# Patient Record
Sex: Female | Born: 1970 | Race: White | Hispanic: Yes | Marital: Married | Smoking: Never smoker
Health system: Southern US, Community
[De-identification: ages and names within clinical notes are randomized; demographics above are authoritative.]

## PROBLEM LIST (undated history)

## (undated) DIAGNOSIS — E119 Type 2 diabetes mellitus without complications: Secondary | ICD-10-CM

---

## 1999-09-22 ENCOUNTER — Encounter: Payer: Self-pay | Admitting: *Deleted

## 1999-09-22 ENCOUNTER — Inpatient Hospital Stay (HOSPITAL_COMMUNITY): Admission: AD | Admit: 1999-09-22 | Discharge: 1999-09-22 | Payer: Self-pay | Admitting: *Deleted

## 1999-10-19 ENCOUNTER — Ambulatory Visit (HOSPITAL_COMMUNITY): Admission: RE | Admit: 1999-10-19 | Discharge: 1999-10-19 | Payer: Self-pay | Admitting: *Deleted

## 1999-12-15 ENCOUNTER — Encounter: Payer: Self-pay | Admitting: Obstetrics

## 1999-12-15 ENCOUNTER — Inpatient Hospital Stay (HOSPITAL_COMMUNITY): Admission: AD | Admit: 1999-12-15 | Discharge: 1999-12-15 | Payer: Self-pay | Admitting: Obstetrics

## 2000-01-05 ENCOUNTER — Inpatient Hospital Stay (HOSPITAL_COMMUNITY): Admission: AD | Admit: 2000-01-05 | Discharge: 2000-01-05 | Payer: Self-pay | Admitting: *Deleted

## 2000-01-13 ENCOUNTER — Inpatient Hospital Stay (HOSPITAL_COMMUNITY): Admission: AD | Admit: 2000-01-13 | Discharge: 2000-01-13 | Payer: Self-pay | Admitting: Obstetrics

## 2000-01-27 ENCOUNTER — Encounter: Admission: RE | Admit: 2000-01-27 | Discharge: 2000-01-27 | Payer: Self-pay | Admitting: Obstetrics & Gynecology

## 2000-02-03 ENCOUNTER — Encounter: Admission: RE | Admit: 2000-02-03 | Discharge: 2000-02-03 | Payer: Self-pay | Admitting: Obstetrics & Gynecology

## 2000-02-17 ENCOUNTER — Inpatient Hospital Stay (HOSPITAL_COMMUNITY): Admission: AD | Admit: 2000-02-17 | Discharge: 2000-02-17 | Payer: Self-pay | Admitting: Obstetrics

## 2000-02-17 ENCOUNTER — Encounter: Admission: RE | Admit: 2000-02-17 | Discharge: 2000-02-17 | Payer: Self-pay | Admitting: Obstetrics & Gynecology

## 2000-02-17 ENCOUNTER — Encounter: Payer: Self-pay | Admitting: Obstetrics

## 2000-02-24 ENCOUNTER — Encounter (HOSPITAL_COMMUNITY): Admission: RE | Admit: 2000-02-24 | Discharge: 2000-02-26 | Payer: Self-pay | Admitting: Obstetrics & Gynecology

## 2000-02-24 ENCOUNTER — Encounter: Admission: RE | Admit: 2000-02-24 | Discharge: 2000-02-24 | Payer: Self-pay | Admitting: Obstetrics & Gynecology

## 2000-02-25 ENCOUNTER — Inpatient Hospital Stay (HOSPITAL_COMMUNITY): Admission: AD | Admit: 2000-02-25 | Discharge: 2000-02-25 | Payer: Self-pay | Admitting: Obstetrics

## 2000-02-27 ENCOUNTER — Inpatient Hospital Stay (HOSPITAL_COMMUNITY): Admission: AD | Admit: 2000-02-27 | Discharge: 2000-02-29 | Payer: Self-pay | Admitting: *Deleted

## 2002-02-12 ENCOUNTER — Emergency Department (HOSPITAL_COMMUNITY): Admission: EM | Admit: 2002-02-12 | Discharge: 2002-02-12 | Payer: Self-pay | Admitting: Emergency Medicine

## 2002-02-19 ENCOUNTER — Emergency Department (HOSPITAL_COMMUNITY): Admission: EM | Admit: 2002-02-19 | Discharge: 2002-02-19 | Payer: Self-pay | Admitting: Emergency Medicine

## 2003-12-03 ENCOUNTER — Ambulatory Visit: Payer: Self-pay | Admitting: Family Medicine

## 2004-03-30 ENCOUNTER — Inpatient Hospital Stay (HOSPITAL_COMMUNITY): Admission: AD | Admit: 2004-03-30 | Discharge: 2004-03-31 | Payer: Self-pay | Admitting: *Deleted

## 2004-04-18 ENCOUNTER — Encounter (INDEPENDENT_AMBULATORY_CARE_PROVIDER_SITE_OTHER): Payer: Self-pay | Admitting: *Deleted

## 2004-04-22 ENCOUNTER — Ambulatory Visit: Payer: Self-pay | Admitting: Family Medicine

## 2004-05-04 ENCOUNTER — Ambulatory Visit: Payer: Self-pay | Admitting: Sports Medicine

## 2004-05-05 ENCOUNTER — Ambulatory Visit (HOSPITAL_COMMUNITY): Admission: RE | Admit: 2004-05-05 | Discharge: 2004-05-05 | Payer: Self-pay | Admitting: *Deleted

## 2004-06-02 ENCOUNTER — Ambulatory Visit: Payer: Self-pay | Admitting: Family Medicine

## 2004-07-06 ENCOUNTER — Ambulatory Visit: Payer: Self-pay | Admitting: Sports Medicine

## 2004-07-06 ENCOUNTER — Ambulatory Visit: Payer: Self-pay | Admitting: Obstetrics and Gynecology

## 2004-07-06 ENCOUNTER — Inpatient Hospital Stay (HOSPITAL_COMMUNITY): Admission: AD | Admit: 2004-07-06 | Discharge: 2004-07-06 | Payer: Self-pay | Admitting: *Deleted

## 2004-07-23 ENCOUNTER — Ambulatory Visit: Payer: Self-pay | Admitting: Family Medicine

## 2004-08-18 ENCOUNTER — Ambulatory Visit: Payer: Self-pay | Admitting: Family Medicine

## 2004-08-28 ENCOUNTER — Ambulatory Visit: Payer: Self-pay | Admitting: Family Medicine

## 2004-09-02 ENCOUNTER — Ambulatory Visit: Payer: Self-pay | Admitting: Family Medicine

## 2004-09-05 ENCOUNTER — Ambulatory Visit: Payer: Self-pay | Admitting: *Deleted

## 2004-09-05 ENCOUNTER — Ambulatory Visit: Payer: Self-pay | Admitting: Obstetrics and Gynecology

## 2004-09-05 ENCOUNTER — Inpatient Hospital Stay (HOSPITAL_COMMUNITY): Admission: AD | Admit: 2004-09-05 | Discharge: 2004-09-07 | Payer: Self-pay | Admitting: Obstetrics and Gynecology

## 2004-09-07 ENCOUNTER — Encounter (INDEPENDENT_AMBULATORY_CARE_PROVIDER_SITE_OTHER): Payer: Self-pay | Admitting: Specialist

## 2004-10-26 ENCOUNTER — Ambulatory Visit: Payer: Self-pay | Admitting: Sports Medicine

## 2006-03-18 ENCOUNTER — Encounter (INDEPENDENT_AMBULATORY_CARE_PROVIDER_SITE_OTHER): Payer: Self-pay | Admitting: *Deleted

## 2006-11-01 ENCOUNTER — Emergency Department (HOSPITAL_COMMUNITY): Admission: EM | Admit: 2006-11-01 | Discharge: 2006-11-01 | Payer: Self-pay | Admitting: Emergency Medicine

## 2006-11-06 ENCOUNTER — Emergency Department (HOSPITAL_COMMUNITY): Admission: EM | Admit: 2006-11-06 | Discharge: 2006-11-06 | Payer: Self-pay | Admitting: Emergency Medicine

## 2006-11-15 ENCOUNTER — Encounter (INDEPENDENT_AMBULATORY_CARE_PROVIDER_SITE_OTHER): Payer: Self-pay | Admitting: General Surgery

## 2006-11-15 ENCOUNTER — Ambulatory Visit (HOSPITAL_COMMUNITY): Admission: RE | Admit: 2006-11-15 | Discharge: 2006-11-15 | Payer: Self-pay | Admitting: General Surgery

## 2010-06-02 NOTE — Op Note (Signed)
NAME:  Samantha Douglas, Samantha Douglas       ACCOUNT NO.:  [id number]   MEDICAL RECORD NO.:  [id number]          PATIENT TYPE:  AMB   LOCATION:  DAY                          FACILITY:  [facility]   PHYSICIAN:  Adolph Pollack, M.D.DATE OF BIRTH:  [birth year]   DATE OF PROCEDURE:  11/15/2006  DATE OF DISCHARGE:                               OPERATIVE REPORT   PREOPERATIVE DIAGNOSIS:  Symptomatic cholelithiasis.   POSTOPERATIVE DIAGNOSIS:  Symptomatic cholelithiasis plus chronic  cholecystitis.   PROCEDURE:  Laparoscopic cholecystectomy with intraoperative  cholangiogram.   SURGEON:  Adolph Pollack, M.D.   ANESTHESIA:  General.   INDICATIONS:  Ms. Samantha Douglas is a 40 year old female who presented to the  emergency department with right upper quadrant epigastric pain.  She was  noted to have gallstones but no liver function elevation.  She was  treated medically and improved some and now presents for elective  cholecystectomy.  This procedure and the risks have been discussed with  her preoperatively.   TECHNIQUE:  She was brought to the operating room, placed supine on the  operating table, and a general anesthetic was administered.  The  abdominal wall was sterilely prepped and draped.   Dilute contrast was injected into the supraumbilical region.  A small  supraumbilical transverse incision was made through the skin and  subcutaneous tissue.  The midline fascia was identified and small  incision made in it, and the peritoneal cavity was entered.  A  pursestring suture of 0 Vicryl was placed around the fascial edges.  A  Hassan trocar was introduced into the peritoneal cavity, and the  pneumoperitoneum was created by insufflation of CO2 gas.   Following this, the laparoscope was introduced, and no underlying  visceral injury was noted.  She was placed in the reverse Trendelenburg  position with the right side tilted up.  Adhesions were noted between  the liver and the anterior abdominal  wall.   An 11-mm trocar was placed in the epigastric incision and two 5-mm  trocars were placed in the right upper quadrant region.  The fundus of  the gallbladder was grasped.  There were adhesions between it and the  omentum and duodenum.  These were taken down sharply.  The fundus was  retracted toward the right shoulder.  The infundibulum was grasped, and  there was a stone in the infundibulum.  I carefully mobilized the  infundibulum with blunt dissection, staying close to it.  I was then  able to identify the cystic duct and create a window around it.  A clip  was placed at the cystic duct/ gallbladder junction, and a small  incision was made in the cystic duct.  A cholangiocatheter was placed  through the anterior abdominal wall, placed into the cystic duct, and a  cholangiogram was performed.   Under real time fluoroscopy, dilute contrast was injected into the  cystic duct which was of short length.  The common hepatic, right and  left hepatic, and common bile ducts all filled promptly and contrast  drained into the duodenum.  There was no obvious evidence of  obstruction.  Final report is pending the radiologist's interpretation.  The cholangiocatheter was removed, and the cystic duct was clipped 3  times proximally and divided.  Using blunt dissection close to the  gallbladder, I identified a posterior and an anterior branch of the  cystic artery.  Windows were created around these, and they were clipped  and divided.  I then dissected the gallbladder free from the liver using  the electrocautery and placed it in an Endopouch bag.   Of note was that some of the adhesions between the liver and the  anterior abdominal wall caused a little bit of disruption of the liver  capsule with minimal bleeding.  This was controlled just by placing a  piece of Surgicel in that area.   The gallbladder fossa was copiously irrigated.  Bleeding points were  controlled with electrocautery.   It was inspected again, and no bleeding  or bile leak was noted.   Irrigation fluid was evacuated.  I then removed the gallbladder through  the supraumbilical incision in the Endopouch bag.  The supraumbilical  fascial defect was closed under laparoscopic vision by tightening up and  tying of the pursestring suture.  The trocars were removed and  pneumoperitoneum was released.  The skin incisions were closed with 4-0  Monocryl subcuticular sutures followed by Steri-Strips and sterile  dressings.   She tolerated procedure without apparent complications was taken to the  recovery room in satisfactory condition.      Adolph Pollack, M.D.  Electronically Signed     TJR/MEDQ  D:  11/15/2006  T:  11/15/2006  Job:  [id number]

## 2010-06-05 NOTE — Op Note (Signed)
NAME:  Samantha Douglas, Samantha Douglas       ACCOUNT NO.:  [id number]   MEDICAL RECORD NO.:  [id number]          PATIENT TYPE:  INP   LOCATION:  9140                          FACILITY:  [facility]   PHYSICIAN:  Phil D. Okey Dupre, M.D.     DATE OF BIRTH:  13-Nov-1970   DATE OF PROCEDURE:  09/07/2004  DATE OF DISCHARGE:                                 OPERATIVE REPORT   PROCEDURE:  Bilateral tubal ligation with partial bilateral salpingectomy.   PREOPERATIVE DIAGNOSIS:  Voluntary sterilization.   POSTOPERATIVE DIAGNOSIS:  Voluntary sterilization.   SURGEON:  Javier Glazier. Okey Dupre, M.D.   FIRST ASSISTANT:  Tracy L. Mayford Knife, M.D.   ANESTHESIA:  General.   SPECIMENS TO PATHOLOGY:  Portions of each fallopian tube.   TOTAL BLOOD LOSS:  Less than 5 mL.   PROCEDURE:  Under satisfactory general anesthesia, the patient in a dorsal  supine position.  The abdomen was prepped and draped in the usual sterile  manner.  A 3 cm transverse incision was made just below the umbilicus  approximately 1 cm below.  The abdomen was entered by layers.  On entering  the peritoneal cavity, the right fallopian tube was identified, grasped with  a Babcock clamp and followed down to confirm that it was tube by visualizing  the fimbriated end and opening made in the avascular portion of mesosalpinx  with a hemostat, through which was drawn a #1 plain catgut suture and tied  around the distal and proximal ends of the tube to form a loop above the tie  approximately 2 cm in length.  The second tie placed below the  aforementioned tie, and the section of tube above the ties excised.  The  free ends of the tube thus exposed were coagulated with hot cautery and the  section of tube removed sent for pathological diagnosis.  The same procedure  was followed with regard to the left fallopian tube.  No bleeding was noted,  and the fascia was closed with continuous running 0 Vicryl on an atraumatic  needle.  The skin edges were approximated with  3-0 Vicryl suture, a dry  sterile dressing was applied, the patient transferred to the recovery range  of motion in satisfactory condition, having tolerated the procedure well.  Tape, instrument, sponge and needle count were reported correct at the end  of the procedure.           ______________________________  Javier Glazier. Okey Dupre, M.D.     PDR/MEDQ  D:  09/07/2004  T:  09/07/2004  Job:  [id number]

## 2010-10-28 LAB — POCT PREGNANCY, URINE
Operator id: 294511
Preg Test, Ur: NEGATIVE

## 2010-10-28 LAB — COMPREHENSIVE METABOLIC PANEL
ALT: 27
BUN: 15
Calcium: 8.7
Chloride: 103
Potassium: 3.2 — ABNORMAL LOW

## 2010-10-28 LAB — URINALYSIS, ROUTINE W REFLEX MICROSCOPIC
Nitrite: NEGATIVE
Protein, ur: NEGATIVE
Specific Gravity, Urine: 1.018
Urobilinogen, UA: 1

## 2010-10-28 LAB — DIFFERENTIAL
Basophils Absolute: 0
Basophils Relative: 0
Eosinophils Absolute: 0.1
Eosinophils Relative: 1
Lymphocytes Relative: 19
Neutro Abs: 5.2
Neutrophils Relative %: 74

## 2010-10-28 LAB — LIPASE, BLOOD: Lipase: 24

## 2010-10-28 LAB — CBC
HCT: 39.2
MCHC: 34.3
MCV: 89.9
WBC: 7

## 2010-10-29 LAB — I-STAT 8, (EC8 V) (CONVERTED LAB)
Acid-base deficit: 2
Bicarbonate: 23.2
HCT: 41
Hemoglobin: 13.9
Potassium: 3.3 — ABNORMAL LOW
Sodium: 137
pCO2, Ven: 41.2 — ABNORMAL LOW

## 2010-10-29 LAB — CBC
HCT: 39.2
Hemoglobin: 13.6
RDW: 12.7

## 2010-10-29 LAB — POCT I-STAT CREATININE
Creatinine, Ser: 0.8
Operator id: 279831

## 2010-10-29 LAB — HEPATIC FUNCTION PANEL
Alkaline Phosphatase: 67
Bilirubin, Direct: <0.1
Total Bilirubin: 0.5

## 2010-10-29 LAB — POCT PREGNANCY, URINE
Operator id: 279831
Preg Test, Ur: NEGATIVE

## 2010-10-29 LAB — DIFFERENTIAL
Eosinophils Absolute: 0
Eosinophils Relative: 1
Lymphocytes Relative: 11 — ABNORMAL LOW
Neutro Abs: 7.7

## 2010-10-29 LAB — URINALYSIS, ROUTINE W REFLEX MICROSCOPIC
Bilirubin Urine: NEGATIVE
Ketones, ur: NEGATIVE
Nitrite: NEGATIVE
Protein, ur: NEGATIVE
Specific Gravity, Urine: 1.03

## 2021-06-16 ENCOUNTER — Emergency Department (HOSPITAL_COMMUNITY): Payer: Self-pay

## 2021-06-16 ENCOUNTER — Other Ambulatory Visit: Payer: Self-pay

## 2021-06-16 ENCOUNTER — Encounter (HOSPITAL_COMMUNITY): Payer: Self-pay

## 2021-06-16 ENCOUNTER — Emergency Department (HOSPITAL_COMMUNITY)
Admission: EM | Admit: 2021-06-16 | Discharge: 2021-06-16 | Disposition: A | Discharge status: Home / Self Care | Payer: Self-pay | Attending: Emergency Medicine | Admitting: Emergency Medicine

## 2021-06-16 DIAGNOSIS — Q758 Other specified congenital malformations of skull and face bones: Secondary | ICD-10-CM

## 2021-06-16 DIAGNOSIS — M542 Cervicalgia: Secondary | ICD-10-CM | POA: Insufficient documentation

## 2021-06-16 DIAGNOSIS — Q759 Congenital malformation of skull and face bones, unspecified: Secondary | ICD-10-CM | POA: Insufficient documentation

## 2021-06-16 DIAGNOSIS — R42 Dizziness and giddiness: Secondary | ICD-10-CM | POA: Insufficient documentation

## 2021-06-16 HISTORY — DX: Type 2 diabetes mellitus without complications: E11.9

## 2021-06-16 LAB — BASIC METABOLIC PANEL
Anion gap: 12 (ref 5–15)
BUN: 8 mg/dL (ref 6–20)
CO2: 22 mmol/L (ref 22–32)
Calcium: 9 mg/dL (ref 8.9–10.3)
Chloride: 98 mmol/L (ref 98–111)
Creatinine, Ser: 0.7 mg/dL (ref 0.44–1.00)
GFR, Estimated: >60 mL/min (ref >60)
Glucose, Bld: 373 mg/dL — ABNORMAL HIGH (ref 70–99)
Potassium: 3.4 mmol/L — ABNORMAL LOW (ref 3.5–5.1)
Sodium: 132 mmol/L — ABNORMAL LOW (ref 135–145)

## 2021-06-16 LAB — CBC
HCT: 42.7 % (ref 36.0–46.0)
Hemoglobin: 15.3 g/dL — ABNORMAL HIGH (ref 12.0–15.0)
MCH: 31.7 pg (ref 26.0–34.0)
MCHC: 35.8 g/dL (ref 30.0–36.0)
MCV: 88.6 fL (ref 80.0–100.0)
Platelets: 189 10*3/uL (ref 150–400)
RBC: 4.82 MIL/uL (ref 3.87–5.11)
RDW: 12.5 % (ref 11.5–15.5)
WBC: 8.2 10*3/uL (ref 4.0–10.5)
nRBC: 0 % (ref 0.0–0.2)

## 2021-06-16 LAB — URINALYSIS, ROUTINE W REFLEX MICROSCOPIC
Bilirubin Urine: NEGATIVE
Glucose, UA: >=500 mg/dL — AB
Hgb urine dipstick: NEGATIVE
Ketones, ur: 20 mg/dL — AB
Leukocytes,Ua: NEGATIVE
Nitrite: POSITIVE — AB
Protein, ur: NEGATIVE mg/dL
Specific Gravity, Urine: 1.033 — ABNORMAL HIGH (ref 1.005–1.030)
pH: 5 (ref 5.0–8.0)

## 2021-06-16 LAB — TROPONIN I (HIGH SENSITIVITY)
Troponin I (High Sensitivity): 3 ng/L (ref <18)
Troponin I (High Sensitivity): 3 ng/L (ref <18)

## 2021-06-16 MED ORDER — IOHEXOL 350 MG/ML SOLN
80.0000 mL | Freq: Once | INTRAVENOUS | Status: AC | PRN
  Administered 2021-06-16: 80 mL via INTRAVENOUS

## 2021-06-16 MED ORDER — KETOROLAC TROMETHAMINE 15 MG/ML IJ SOLN
15.0000 mg | Freq: Once | INTRAMUSCULAR | Status: AC
  Administered 2021-06-16: 15 mg via INTRAVENOUS
  Filled 2021-06-16: qty 1

## 2021-06-16 MED ORDER — SODIUM CHLORIDE 0.9 % IV BOLUS
1000.0000 mL | Freq: Once | INTRAVENOUS | Status: AC
  Administered 2021-06-16: 1000 mL via INTRAVENOUS

## 2021-06-16 MED ORDER — LIDOCAINE 5 % EX PTCH
1.0000 | MEDICATED_PATCH | CUTANEOUS | Status: DC
  Administered 2021-06-16: 1 via TRANSDERMAL
  Filled 2021-06-16: qty 1

## 2021-06-16 MED ORDER — PREDNISONE 20 MG PO TABS
60.0000 mg | ORAL_TABLET | Freq: Once | ORAL | Status: AC
  Administered 2021-06-16: 60 mg via ORAL
  Filled 2021-06-16: qty 3

## 2021-06-16 NOTE — ED Provider Notes (Signed)
[facility] EMERGENCY DEPARTMENT Provider Note   CSN: [id number] Arrival date & time: 06/16/21  1135     History  Chief Complaint  Patient presents with   Dizziness   Nausea    Samantha Douglas is a 51 y.o. female with PMHx Diabetes who presents to the ED today from UC with complaint of dizziness/pre syncope that began this morning. LKN 11:30 PM last night.  Patient reports that she woke up this morning feeling like she could pass out.  She was also experiencing blurred vision in her left eye, neck pain, left arm pain, difficulty breathing, nausea, and diaphoresis. She went to work and had her blood pressure checked at that time and it was in the 123456 systolic.  She does not take anything for blood pressure.  She went to urgent care who reports noticed a mild facial droop on the right side and advised she come to the ED for further evaluation with concern for possibility of stroke.  Patient states that she continues to have pain in her left arm and neck however otherwise feels improved.  She denies any history of stroke in the past.  Denies specific chest pain.  Spouse is at bedside and states that she is acting at baseline and has not noticed any speech changes.  She denies any unilateral weakness or numbness.  She has no other complaints at this time.  The history is provided by the patient, the spouse and medical records.      Home Medications Prior to Admission medications   Not on File      Allergies    Patient has no known allergies.    Review of Systems   Review of Systems  Constitutional:  Positive for diaphoresis. Negative for chills and fever.  Eyes:  Positive for visual disturbance (left eye blurry vision).  Respiratory:  Positive for shortness of breath.   Cardiovascular:  Negative for chest pain.  Gastrointestinal:  Positive for nausea. Negative for abdominal pain, diarrhea and vomiting.  Musculoskeletal:  Positive for arthralgias and neck  pain.  Neurological:  Positive for dizziness, facial asymmetry and light-headedness. Negative for syncope, speech difficulty, weakness and numbness.  All other systems reviewed and are negative.  Physical Exam Updated Vital Signs BP 129/79   Pulse (!) 102   Temp 97.6 F (36.4 C) (Oral)   Resp 16   Ht 5\' 1"  (1.549 m)   Wt 79.4 kg   SpO2 96%   BMI 33.07 kg/m  Physical Exam Vitals and nursing note reviewed.  Constitutional:      Appearance: She is not ill-appearing or diaphoretic.  HENT:     Head: Normocephalic and atraumatic.  Eyes:     Extraocular Movements: Extraocular movements intact.     Conjunctiva/sclera: Conjunctivae normal.     Pupils: Pupils are equal, round, and reactive to light.  Neck:     Comments: No C midline spinal TTP. Mild bilateral paracervical/trapezius musculature TTP.  Cardiovascular:     Rate and Rhythm: Normal rate and regular rhythm.     Pulses: Normal pulses.  Pulmonary:     Effort: Pulmonary effort is normal.     Breath sounds: Normal breath sounds. No wheezing, rhonchi or rales.  Abdominal:     Palpations: Abdomen is soft.     Tenderness: There is no abdominal tenderness.  Musculoskeletal:     Cervical back: Neck supple.     Comments: No swelling appreciated to LUE. No obvious TTP however patient endorses  pain throughout. ROM intact to LUE. Strength 5/5. Sensation intact throughout. 2+ radial pulse.   Skin:    General: Skin is warm and dry.  Neurological:     Mental Status: She is alert.     Comments: Alert and oriented to self, place, time and event.   Speech is fluent, clear without dysarthria or dysphasia.   Strength 5/5 in upper/lower extremities   Sensation intact in upper/lower extremities   Normal gait.  Negative Romberg. No pronator drift.  Normal finger-to-nose and feet tapping.  CN I not tested  CN II grossly intact visual fields bilaterally. Did not visualize posterior eye.  CN III, IV, VI PERRLA and EOMs intact bilaterally   CN V Intact sensation to sharp and light touch to the face  Questionable slight right sided facial droop CN VIII not tested  CN IX, X no uvula deviation, symmetric rise of soft palate  CN XI 5/5 SCM and trapezius strength bilaterally  CN XII Midline tongue protrusion, symmetric L/R movements     ED Results / Procedures / Treatments   Labs (all labs ordered are listed, but only abnormal results are displayed) Labs Reviewed  BASIC METABOLIC PANEL - Abnormal; Notable for the following components:      Result Value   Sodium 132 (*)    Potassium 3.4 (*)    Glucose, Bld 373 (*)    All other components within normal limits  CBC - Abnormal; Notable for the following components:   Hemoglobin 15.3 (*)    All other components within normal limits  URINALYSIS, ROUTINE W REFLEX MICROSCOPIC - Abnormal; Notable for the following components:   APPearance HAZY (*)    Specific Gravity, Urine 1.033 (*)    Glucose, UA >=500 (*)    Ketones, ur 20 (*)    Nitrite POSITIVE (*)    Bacteria, UA FEW (*)    All other components within normal limits  CBG MONITORING, ED  TROPONIN I (HIGH SENSITIVITY)  TROPONIN I (HIGH SENSITIVITY)    EKG None  Radiology CT ANGIO HEAD NECK W WO CM  Result Date: 06/16/2021 CLINICAL DATA:  Stroke follow-up. Dizziness and nausea. Left arm and neck pain. EXAM: CT ANGIOGRAPHY HEAD AND NECK TECHNIQUE: Multidetector CT imaging of the head and neck was performed using the standard protocol during bolus administration of intravenous contrast. Multiplanar CT image reconstructions and MIPs were obtained to evaluate the vascular anatomy. Carotid stenosis measurements (when applicable) are obtained utilizing NASCET criteria, using the distal internal carotid diameter as the denominator. RADIATION DOSE REDUCTION: This exam was performed according to the departmental dose-optimization program which includes automated exposure control, adjustment of the mA and/or kV according to patient  size and/or use of iterative reconstruction technique. CONTRAST:  78mL OMNIPAQUE IOHEXOL 350 MG/ML SOLN COMPARISON:  None Available. FINDINGS: CT HEAD FINDINGS Brain: No evidence of acute infarction, hemorrhage, hydrocephalus, extra-axial collection or mass lesion/mass effect. 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 C1 is fused with the occipital bone. The tip of the dens is 4 mm above McRae line consistent with basilar invagination. 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 Cerebellar tonsils above the foramen magnum. Vascular: Negative for hyperdense vessel Skull: No focal lesion identified. Sinuses: Mild mucosal edema maxillary sinus bilaterally. Remaining sinuses clear. Orbits: Negative Review of the MIP images confirms the above findings CTA NECK FINDINGS Aortic arch: Standard branching. Imaged portion shows no evidence of aneurysm or dissection. No significant stenosis  of the major arch vessel origins. Right carotid system: Normal right carotid. Negative for atherosclerotic disease or dissection Left carotid system: Normal left carotid. Negative for atherosclerotic disease or dissection. Vertebral arteries: Normal vertebral artery bilaterally. 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 Other neck: Negative for mass or adenopathy Upper chest: Lung apices clear bilaterally Review of the MIP images confirms the above findings CTA HEAD FINDINGS Anterior circulation: Internal carotid artery widely patent through the skull base and cavernous segment. No stenosis or aneurysm. Hypoplastic right A1 segment. Anterior and middle cerebral arteries patent bilaterally without stenosis. Posterior circulation: Both vertebral arteries patent to the basilar without stenosis. PICA patent bilaterally. Basilar patent. AICA, superior cerebellar and posterior cerebral arteries patent  bilaterally. Fetal origin right posterior cerebral artery. Negative for aneurysm. Venous sinuses: Normal venous enhancement Anatomic variants: None Review of the MIP images confirms the above findings IMPRESSION: 1. CT head negative for acute abnormality. 2. 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 3. Normal CT angio head and neck. No intracranial large vessel occlusion or stenosis. Negative for dissection. Electronically Signed   By: Franchot Gallo M.D.   On: 06/16/2021 17:30   DG Chest 2 View  Result Date: 06/16/2021 CLINICAL DATA:  Pt arrived POV from urgent care c/o left arm and neck pain along with dizziness and nausea. Pt states she was at work and got dizzy, felt like she was going to throw up and got extremely hot. EXAM: CHEST - 2 VIEW COMPARISON:  None Available. FINDINGS: Lungs are clear. Heart size and mediastinal contours are within normal limits. No effusion.  No pneumothorax. Spurring in the lumbar spine.  Cholecystectomy clips. IMPRESSION: No acute cardiopulmonary disease. Electronically Signed   By: Lucrezia Europe M.D.   On: 06/16/2021 16:43   MR BRAIN WO CONTRAST  Result Date: 06/16/2021 CLINICAL DATA:  Follow-up examination for acute dizziness. EXAM: MRI HEAD WITHOUT CONTRAST TECHNIQUE: Multiplanar, multiecho pulse sequences of the brain and surrounding structures were obtained without intravenous contrast. COMPARISON:  Prior CT from earlier the same day. FINDINGS: Brain: Cerebral volume within normal limits. Scattered patchy T2/FLAIR hyperintensity involving the periventricular, deep, and subcortical white matter both cerebral hemispheres, nonspecific, but most likely related chronic microvascular ischemic disease. No evidence for acute or subacute ischemia. Gray-white matter differentiation maintained. No areas of chronic cortical infarction. No acute intracranial hemorrhage. Single punctate chronic micro hemorrhage noted involving the  right periatrial white matter, of doubtful significance in isolation. No mass lesion, midline shift or mass effect. No hydrocephalus or extra-axial fluid collection. Pituitary gland suprasellar region within normal limits. Midline structures intact. Vascular: Major intracranial vascular flow voids are maintained. 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 Bone marrow signal intensity within normal limits. No scalp soft tissue abnormality. Sinuses/Orbits: Globes and orbital soft tissues within normal limits. Mild scattered mucosal thickening noted throughout the paranasal sinuses. Trace right mastoid effusion, of doubtful significance. Inner ear structures grossly normal. Other: None. IMPRESSION: 1. No acute intracranial abnormality. 2. Mild cerebral white matter disease, nonspecific, but most likely related chronic microvascular ischemic disease. 3. 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 Electronically Signed   By: Jeannine Boga M.D.   On: 06/16/2021 21:01    Procedures Procedures    Medications Ordered in ED Medications  lidocaine (LIDODERM)  5 % 1 patch (1 patch Transdermal Patch Applied 06/16/21 1841)  sodium chloride 0.9 % bolus 1,000 mL (0 mLs Intravenous Stopped 06/16/21 2204)  iohexol (OMNIPAQUE) 350 MG/ML injection 80 mL (80 mLs Intravenous Contrast Given 06/16/21 1708)  predniSONE (DELTASONE) tablet 60 mg (60 mg Oral Given 06/16/21 1841)  ketorolac (TORADOL) 15 MG/ML injection 15 mg (15 mg Intravenous Given 06/16/21 1841)    ED Course/ Medical Decision Making/ A&P                           Medical Decision Making 51 year old female who presents to the ED today from urgent care with concern for stroke.  Last known normal 11:30 PM last night.  Woke up this morning with associated dizziness/lightheadedness, nausea, diaphoresis, shortness of  breath, neck pain, left arm pain.  Noted to have facial droop at urgent care and sent here for further eval.  On arrival to the ED today blood pressure 143/96.  Remainder vitals are unremarkable.  Patient appears to be in no acute distress at this time.  She had screening labs done while in the waiting room including CBC, BMP, urinalysis, EKG.  CBC without leukocytosis.  Hemoglobin stable. BMP with a sodium of 132 with a potassium of 3.4.  Glucose is elevated at 373, history of diabetes.  Bicarb 22 and no gap to suggest DKA. Urinalysis with increased specific gravity of 1.033 and ketones of 20.  She is nitrite positive.  She denies any urinary symptoms at this time.  We will plan to work-up for stroke at this time.  We will add on troponin given complaint of nausea and diaphoresis with left arm pain and add on chest x-ray.  Given complaint of dizziness, blurry vision we will plan for CTAs to assess for any concern for dissection at this time.  If negative patient may require MRI.  CTAs: IMPRESSION:  1. CT head negative for acute abnormality.  2. 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  3. Normal CT angio head and neck. No intracranial large vessel  occlusion or stenosis. Negative for dissection.   Troponin of 4. Will repeat.   On reeval pt resting comfortably. She reports blurry vision in both eyes compared to just left earlier. Visual Acuity Bilateral Near: 20/100 R Near: 10/32 L Near: 20/100. She mentions feeling like her equilibrium is off with ambulation - given same will proceed with MRI for further eval. She denies any trauma to her neck or arm. She is noted to have bilateral paracervical musculature TTP. She mentions similar pain in the past a couple of months ago however resolved on its own after 3 days. She states pain is about 2/10 however what mostly brought her in was the dizziness today. Will plan for pain medication and  reevaluation. Ultimately if MRI negative pt to be discharged with PCP follow up. Question near syncope today vs acute stroke?  MRI: IMPRESSION:  1. No acute intracranial abnormality.  2. Mild cerebral white matter disease, nonspecific, but most likely  related chronic microvascular ischemic disease.  3. Atlantooccipital assimilation with congenital fusion of C2 and  C3. Associated basilar invagination.   On reeval pt resting comfortably. Reports she is ready to go home. At baseline currently. Will discharge home with soft collar. Pt instructed she can take collar off to shower however otherwise should keep it on. She is in agreement with plan and  stable for discharge home.    Problems Addressed: Basilar invagination: acute illness or injury Dizziness: acute illness or injury Neck pain: acute illness or injury  Amount and/or Complexity of Data Reviewed Labs: ordered. Radiology: ordered. Discussion of management or test interpretation with external provider(s): Discussed case with Dr. Curly Shores Neurology given MRI findings of basilar invagination; who does not recommend anything from her end.   Discussed case with Jinny Blossom with Neurosurgery who discussed case with Dr. Annette Stable - recommends soft collar and outpatient follow up.   Risk Prescription drug management.          Final Clinical Impression(s) / ED Diagnoses Final diagnoses:  Dizziness  Neck pain  Basilar invagination    Rx / DC Orders ED Discharge Orders     None        Discharge Instructions      Please follow up with Dr. Annette Stable Neurosurgery for further evaluation of your findings on MRI today.   It is recommended that you wear the soft collar provided to you at all times. It can be taken off to shower however otherwise should be kept on.   Please also follow up with your PCP for further eval.   Return to the ED for any new/worsening symptoms       Eustaquio Maize, PA-C 06/16/21 2251    Regan Lemming, MD 06/17/21 0104

## 2021-06-16 NOTE — Plan of Care (Signed)
Asked to comment on congenital fusion of the C-spine with basilar invagination in this patient, who per ED provider presented with dizziness while ambulating which has now resolved.  The description of the event sounds more peripheral in nature given that she did not have any symptoms while at rest.  Suggest neurosurgical follow-up, could curbside neurosurgery to confirm patient is safe for discharge as well as this is not my area of expertise

## 2021-06-16 NOTE — Discharge Instructions (Signed)
Please follow up with Dr. Annette Stable Neurosurgery for further evaluation of your findings on MRI today.   It is recommended that you wear the soft collar provided to you at all times. It can be taken off to shower however otherwise should be kept on.   Please also follow up with your PCP for further eval.   Return to the ED for any new/worsening symptoms

## 2021-06-16 NOTE — ED Triage Notes (Signed)
Pt arrived POV from urgent care c/o left arm and neck pain along with dizziness and nausea. Pt states she was at work and got dizzy, felt like she was going to throw up and got extremely hot. Pt states her arm and neck are still bothering her.

## 2022-08-20 IMAGING — CT CT ANGIO HEAD-NECK (W OR W/O PERF)
2 of 11 series · 8 of 33 positions shown · IV contrast (OMNI 350)
Comparison: None Available.

CLINICAL DATA: Stroke follow-up. Dizziness and nausea. Left arm and
neck pain.

EXAM:
CT ANGIOGRAPHY HEAD AND NECK
TECHNIQUE: Multidetector CT imaging of the head and neck was performed using
the standard protocol during bolus administration of intravenous
contrast. Multiplanar CT image reconstructions and MIPs were
obtained to evaluate the vascular anatomy. Carotid stenosis
measurements (when applicable) are obtained utilizing NASCET
criteria, using the distal internal carotid diameter as the
denominator.

[Series 5: cta neck · axial · 0.40mm/px · z∈[-250,+50]mm · 3 of 151 slices shown]
[im 1/151  soft-tissue]
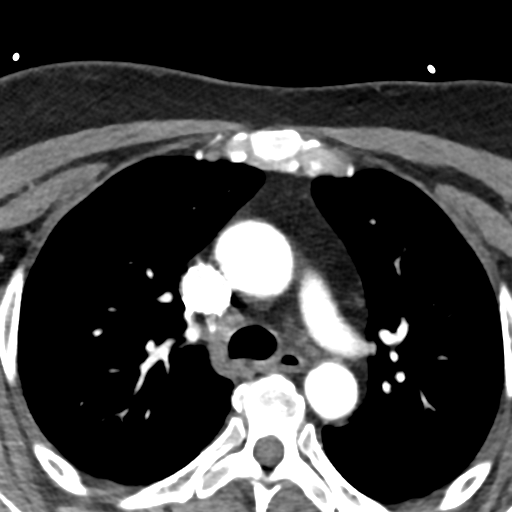
[im 76/151  bone]
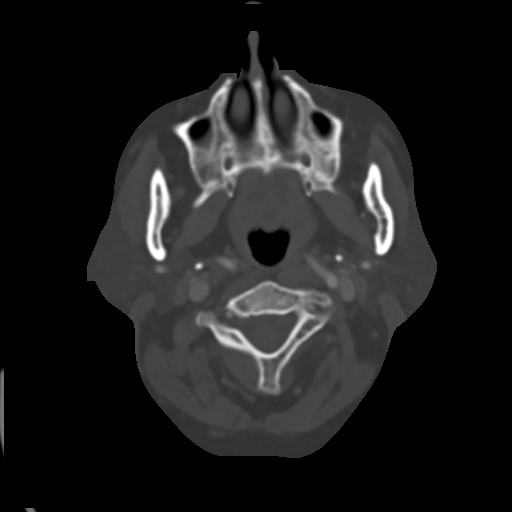
[im 151/151  soft-tissue]
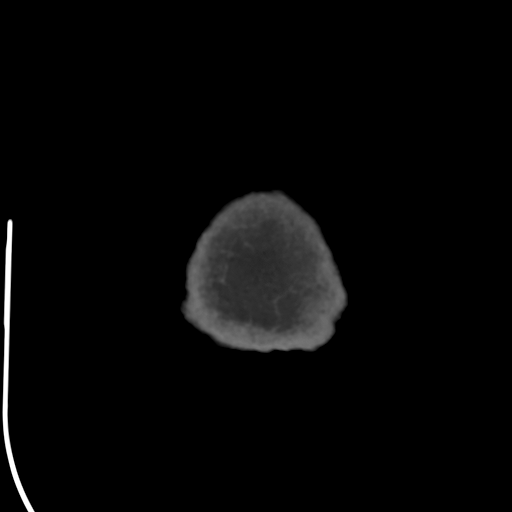

[Series 11: cta neck axial · axial · 0.39mm/px · z∈[-198,+2]mm · 5 of 301 slices shown]
[im 51/301  soft-tissue]
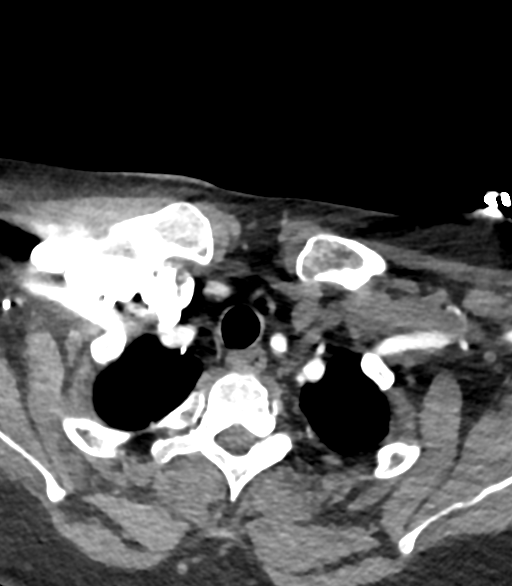
[im 101/301  soft-tissue]
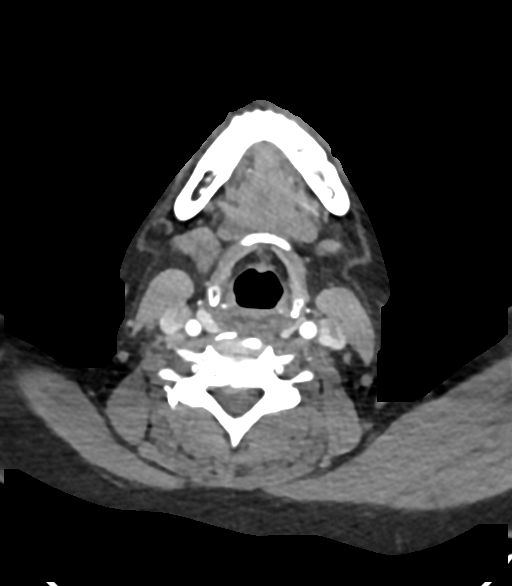
[im 151/301  soft-tissue]
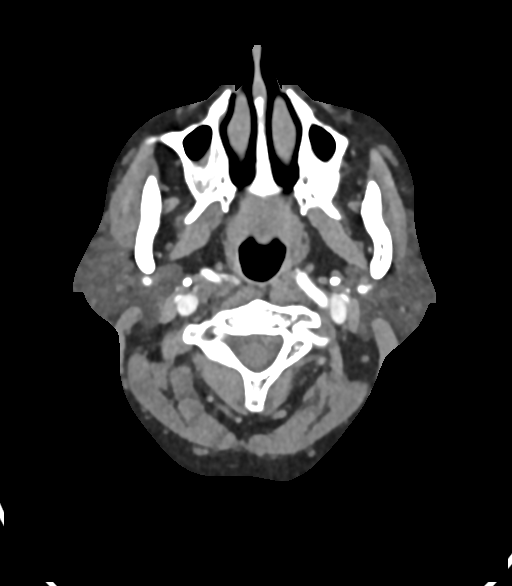
[im 201/301  soft-tissue]
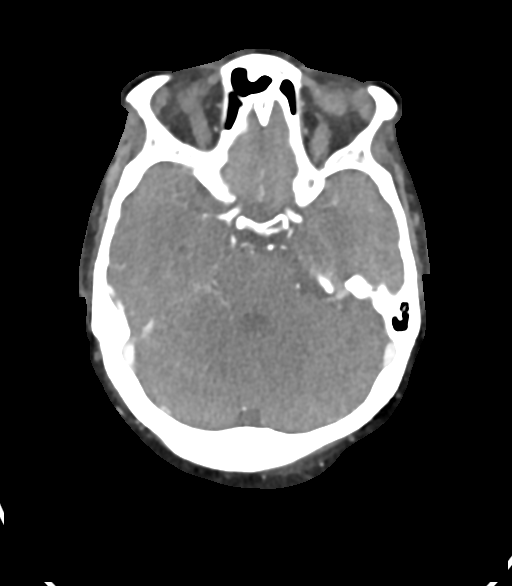
[im 251/301  soft-tissue]
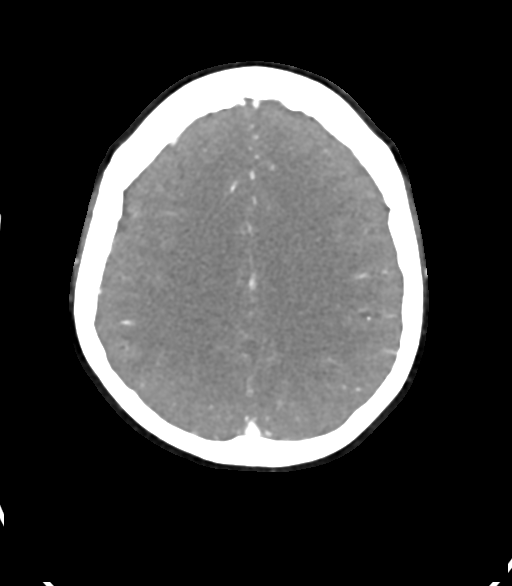

[8 of 33 positions shown; findings below may reference images not displayed]

RADIATION DOSE REDUCTION: This exam was performed according to the
departmental dose-optimization program which includes automated
exposure control, adjustment of the mA and/or kV according to
patient size and/or use of iterative reconstruction technique.

CONTRAST:  80mL OMNIPAQUE IOHEXOL 350 MG/ML SOLN
FINDINGS: CT HEAD FINDINGS

Brain: No evidence of acute infarction, hemorrhage, hydrocephalus,
extra-axial collection or mass lesion/mass effect.

Basilar invagination is noted. There is Byoung Goo deformity with
fusion of C2 and C3. C1 is fused with the occipital bone. The tip of
the dens is 4 mm above Kentarou Carranza [place] consistent with basilar
invagination. There is associated spinal stenosis of the foramen
magnum. Cerebellar tonsils above the foramen magnum.

Vascular: Negative for hyperdense vessel

Skull: No focal lesion identified.

Sinuses: Mild mucosal edema maxillary sinus bilaterally. Remaining
sinuses clear.

Orbits: Negative

Review of the MIP images confirms the above findings

CTA NECK FINDINGS

Aortic arch: Standard branching. Imaged portion shows no evidence of
aneurysm or dissection. No significant stenosis of the major arch
vessel origins.

Right carotid system: Normal right carotid. Negative for
atherosclerotic disease or dissection

Left carotid system: Normal left carotid. Negative for
atherosclerotic disease or dissection.

Vertebral arteries: Normal vertebral artery bilaterally.

Skeleton: Congenital fusion of C1 and the occiput. Congenital fusion
of C2 and C3. Basilar invagination as described above.

Other neck: Negative for mass or adenopathy

Upper chest: Lung apices clear bilaterally

Review of the MIP images confirms the above findings

CTA HEAD FINDINGS

Anterior circulation: Internal carotid artery widely patent through
the skull base and cavernous segment. No stenosis or aneurysm.
Hypoplastic right A1 segment. Anterior and middle cerebral arteries
patent bilaterally without stenosis.

Posterior circulation: Both vertebral arteries patent to the basilar
without stenosis. PICA patent bilaterally. Basilar patent. AICA,
superior cerebellar and posterior cerebral arteries patent
bilaterally. Fetal origin right posterior cerebral artery. Negative
for aneurysm.

Venous sinuses: Normal venous enhancement

Anatomic variants: None

Review of the MIP images confirms the above findings
IMPRESSION: 1. CT head negative for acute abnormality.
2. Basilar vaginal a shin. Congenital fusion of C1 and the occiput.
Congenital fusion of C2 and C3.
3. Normal CT angio head and neck. No intracranial large vessel
occlusion or stenosis. Negative for dissection.

## 2023-04-13 ENCOUNTER — Emergency Department (HOSPITAL_COMMUNITY): Payer: Self-pay

## 2023-04-13 ENCOUNTER — Emergency Department (HOSPITAL_COMMUNITY)
Admission: EM | Admit: 2023-04-13 | Discharge: 2023-04-14 | Disposition: A | Discharge status: Home / Self Care | Payer: Self-pay | Attending: Emergency Medicine | Admitting: Emergency Medicine

## 2023-04-13 ENCOUNTER — Encounter (HOSPITAL_COMMUNITY): Payer: Self-pay

## 2023-04-13 ENCOUNTER — Other Ambulatory Visit: Payer: Self-pay

## 2023-04-13 DIAGNOSIS — S0012XA Contusion of left eyelid and periocular area, initial encounter: Secondary | ICD-10-CM | POA: Insufficient documentation

## 2023-04-13 DIAGNOSIS — W01198A Fall on same level from slipping, tripping and stumbling with subsequent striking against other object, initial encounter: Secondary | ICD-10-CM | POA: Insufficient documentation

## 2023-04-13 DIAGNOSIS — Y9302 Activity, running: Secondary | ICD-10-CM | POA: Insufficient documentation

## 2023-04-13 DIAGNOSIS — S022XXA Fracture of nasal bones, initial encounter for closed fracture: Secondary | ICD-10-CM | POA: Insufficient documentation

## 2023-04-13 MED ORDER — ONDANSETRON 4 MG PO TBDP
8.0000 mg | ORAL_TABLET | Freq: Once | ORAL | Status: AC
  Administered 2023-04-14: 8 mg via ORAL
  Filled 2023-04-13: qty 2

## 2023-04-13 MED ORDER — OXYCODONE-ACETAMINOPHEN 5-325 MG PO TABS
2.0000 | ORAL_TABLET | Freq: Once | ORAL | Status: AC
  Administered 2023-04-14: 2 via ORAL
  Filled 2023-04-13: qty 2

## 2023-04-13 NOTE — ED Triage Notes (Addendum)
 Pt arrived from home via POV s/p mechanical fall while chasing grad daughter. Pt fell to hard floor hitting face and head first. Pt noted to have tennis ball size hematoma to left forehead and obvious deformity to nose. Epistaxis controlled at present. Pt denies loc. Pt a and o x 4. PERRLA

## 2023-04-13 NOTE — ED Provider Notes (Signed)
 [facility] Provider Note   CSN: [id number] Arrival date & time: 04/13/23  2113     History {Add pertinent medical, surgical, social history, OB history to HPI:1} Chief Complaint  Patient presents with   Fall   Head Injury    Samantha Douglas is a 53 y.o. female.  53 yo F who was running and tripped fell on her face full force. Nosebleed for a few minutes then was dizzy. Deformed nose and difficulty breathing. Large hematoma above left eye is improving. No vision changes. Mild nausea, no vomiting. No MSK pain elsewhere.    Fall  Head Injury      Home Medications Prior to Admission medications   Not on File      Allergies    Patient has no known allergies.    Review of Systems   Review of Systems  Physical Exam Updated Vital Signs BP 138/80 (BP Location: Right Arm)   Pulse 79   Temp 98.7 F (37.1 C) (Oral)   Resp 20   Ht 5\' 2"  (1.575 m)   Wt 74.4 kg   LMP 12/19/2022 (Approximate)   SpO2 100%   BMI 30.00 kg/m  Physical Exam Vitals and nursing note reviewed.  Constitutional:      Appearance: She is well-developed.  HENT:     Head: Normocephalic.     Comments: Large hematoma over left eye, significant hematoma of nose without obvious displacement but very would be hidden by the swelling. No nasal septal hematoma. Hemostatic.  Cardiovascular:     Rate and Rhythm: Normal rate and regular rhythm.  Pulmonary:     Effort: No respiratory distress.     Breath sounds: No stridor.  Abdominal:     General: There is no distension.  Musculoskeletal:     Cervical back: Normal range of motion.  Neurological:     Mental Status: She is alert.     ED Results / Procedures / Treatments   Labs (all labs ordered are listed, but only abnormal results are displayed) Labs Reviewed  I-STAT CHEM 8, ED    EKG None  Radiology CT Head Wo Contrast Result Date: 04/13/2023 CLINICAL DATA:  Head trauma, moderate-severe;  Facial trauma, blunt; Neck trauma, dangerous injury mechanism (Age 37-64y). Deformity to nose. Left forehead tennis ball sized hematoma. Trauma EXAM: CT HEAD WITHOUT CONTRAST CT MAXILLOFACIAL WITHOUT CONTRAST CT CERVICAL SPINE WITHOUT CONTRAST TECHNIQUE: Multidetector CT imaging of the head, cervical spine, and maxillofacial structures were performed using the standard protocol without intravenous contrast. Multiplanar CT image reconstructions of the cervical spine and maxillofacial structures were also generated. RADIATION DOSE REDUCTION: This exam was performed according to the departmental dose-optimization program which includes automated exposure control, adjustment of the mA and/or kV according to patient size and/or use of iterative reconstruction technique. COMPARISON:  None Available. FINDINGS: CT HEAD FINDINGS Brain: No evidence of large-territorial acute infarction. No parenchymal hemorrhage. No mass lesion. No extra-axial collection. No mass effect or midline shift. No hydrocephalus. Basilar cisterns are patent. Vascular: No hyperdense vessel. Skull: No acute fracture or focal lesion. Other: Left frontal scalp 7 mm hematoma formation. CT MAXILLOFACIAL FINDINGS Osseous: Acute minimally displaced bilateral nasal bone fractures. Acute minimally displaced fractures of the distal nasal process of bilateral maxilla. No destructive process. Sinuses/Orbits: Bilateral maxillary sinus mucosal thickening. Paranasal sinuses and mastoid air cells are clear. The orbits are unremarkable. Soft tissues: Negative. CT CERVICAL SPINE FINDINGS Alignment: Reversal of the normal cervical lordosis likely due  to positioning Skull base and vertebrae: C2-C3 osseous fusion. Diminutive dense-likely congenital. Multilevel mild degenerative changes of the spine. No acute fracture. No aggressive appearing focal osseous lesion or focal pathologic process. Soft tissues and spinal canal: No prevertebral fluid or swelling. No visible canal  hematoma. Upper chest: Unremarkable. Other: None. IMPRESSION: 1.  No acute intracranial abnormality. 2. Acute minimally displaced bilateral nasal bone fractures. Acute minimally displaced fractures of the distal nasal process of bilateral maxilla. 3. No acute displaced fracture or traumatic listhesis of the cervical spine. 4. Left frontal scalp 7 mm hematoma formation. Electronically Signed   By: Tish Frederickson M.D.   On: 04/13/2023 22:51   CT Maxillofacial Wo Contrast Result Date: 04/13/2023 CLINICAL DATA:  Head trauma, moderate-severe; Facial trauma, blunt; Neck trauma, dangerous injury mechanism (Age 13-64y). Deformity to nose. Left forehead tennis ball sized hematoma. Trauma EXAM: CT HEAD WITHOUT CONTRAST CT MAXILLOFACIAL WITHOUT CONTRAST CT CERVICAL SPINE WITHOUT CONTRAST TECHNIQUE: Multidetector CT imaging of the head, cervical spine, and maxillofacial structures were performed using the standard protocol without intravenous contrast. Multiplanar CT image reconstructions of the cervical spine and maxillofacial structures were also generated. RADIATION DOSE REDUCTION: This exam was performed according to the departmental dose-optimization program which includes automated exposure control, adjustment of the mA and/or kV according to patient size and/or use of iterative reconstruction technique. COMPARISON:  None Available. FINDINGS: CT HEAD FINDINGS Brain: No evidence of large-territorial acute infarction. No parenchymal hemorrhage. No mass lesion. No extra-axial collection. No mass effect or midline shift. No hydrocephalus. Basilar cisterns are patent. Vascular: No hyperdense vessel. Skull: No acute fracture or focal lesion. Other: Left frontal scalp 7 mm hematoma formation. CT MAXILLOFACIAL FINDINGS Osseous: Acute minimally displaced bilateral nasal bone fractures. Acute minimally displaced fractures of the distal nasal process of bilateral maxilla. No destructive process. Sinuses/Orbits: Bilateral  maxillary sinus mucosal thickening. Paranasal sinuses and mastoid air cells are clear. The orbits are unremarkable. Soft tissues: Negative. CT CERVICAL SPINE FINDINGS Alignment: Reversal of the normal cervical lordosis likely due to positioning Skull base and vertebrae: C2-C3 osseous fusion. Diminutive dense-likely congenital. Multilevel mild degenerative changes of the spine. No acute fracture. No aggressive appearing focal osseous lesion or focal pathologic process. Soft tissues and spinal canal: No prevertebral fluid or swelling. No visible canal hematoma. Upper chest: Unremarkable. Other: None. IMPRESSION: 1.  No acute intracranial abnormality. 2. Acute minimally displaced bilateral nasal bone fractures. Acute minimally displaced fractures of the distal nasal process of bilateral maxilla. 3. No acute displaced fracture or traumatic listhesis of the cervical spine. 4. Left frontal scalp 7 mm hematoma formation. Electronically Signed   By: Tish Frederickson M.D.   On: 04/13/2023 22:51   CT Cervical Spine Wo Contrast Result Date: 04/13/2023 CLINICAL DATA:  Head trauma, moderate-severe; Facial trauma, blunt; Neck trauma, dangerous injury mechanism (Age 37-64y). Deformity to nose. Left forehead tennis ball sized hematoma. Trauma EXAM: CT HEAD WITHOUT CONTRAST CT MAXILLOFACIAL WITHOUT CONTRAST CT CERVICAL SPINE WITHOUT CONTRAST TECHNIQUE: Multidetector CT imaging of the head, cervical spine, and maxillofacial structures were performed using the standard protocol without intravenous contrast. Multiplanar CT image reconstructions of the cervical spine and maxillofacial structures were also generated. RADIATION DOSE REDUCTION: This exam was performed according to the departmental dose-optimization program which includes automated exposure control, adjustment of the mA and/or kV according to patient size and/or use of iterative reconstruction technique. COMPARISON:  None Available. FINDINGS: CT HEAD FINDINGS Brain: No  evidence of large-territorial acute infarction. No parenchymal hemorrhage. No mass lesion.  No extra-axial collection. No mass effect or midline shift. No hydrocephalus. Basilar cisterns are patent. Vascular: No hyperdense vessel. Skull: No acute fracture or focal lesion. Other: Left frontal scalp 7 mm hematoma formation. CT MAXILLOFACIAL FINDINGS Osseous: Acute minimally displaced bilateral nasal bone fractures. Acute minimally displaced fractures of the distal nasal process of bilateral maxilla. No destructive process. Sinuses/Orbits: Bilateral maxillary sinus mucosal thickening. Paranasal sinuses and mastoid air cells are clear. The orbits are unremarkable. Soft tissues: Negative. CT CERVICAL SPINE FINDINGS Alignment: Reversal of the normal cervical lordosis likely due to positioning Skull base and vertebrae: C2-C3 osseous fusion. Diminutive dense-likely congenital. Multilevel mild degenerative changes of the spine. No acute fracture. No aggressive appearing focal osseous lesion or focal pathologic process. Soft tissues and spinal canal: No prevertebral fluid or swelling. No visible canal hematoma. Upper chest: Unremarkable. Other: None. IMPRESSION: 1.  No acute intracranial abnormality. 2. Acute minimally displaced bilateral nasal bone fractures. Acute minimally displaced fractures of the distal nasal process of bilateral maxilla. 3. No acute displaced fracture or traumatic listhesis of the cervical spine. 4. Left frontal scalp 7 mm hematoma formation. Electronically Signed   By: Tish Frederickson M.D.   On: 04/13/2023 22:51    Procedures Procedures    Medications Ordered in ED Medications  oxyCODONE-acetaminophen (PERCOCET/ROXICET) 5-325 MG per tablet 2 tablet (has no administration in time range)  ondansetron (ZOFRAN-ODT) disintegrating tablet 8 mg (has no administration in time range)    ED Course/ Medical Decision Making/ A&P                                 Medical Decision Making Amount  and/or Complexity of Data Reviewed ECG/medicine tests: ordered.  Risk Prescription drug management.  Patient is mildly displaced nasal bone fractures and maxillary chip fracture.  Nothing quiring surgery.  Nasal passages open on the left slightly occluded on the right but no nasal septal hematoma.  Hemostatic at this time.  Discussed ice and follow-up with ENT if deformed after swelling improved.  Will add on a i-STAT and give her some pain medication nausea medication reeval. ***  {Document critical care time when appropriate:1} {Document review of labs and clinical decision tools ie heart score, Chads2Vasc2 etc:1}  {Document your independent review of radiology images, and any outside records:1} {Document your discussion with family members, caretakers, and with consultants:1} {Document social determinants of health affecting pt's care:1} {Document your decision making why or why not admission, treatments were needed:1} Final Clinical Impression(s) / ED Diagnoses Final diagnoses:  None    Rx / DC Orders ED Discharge Orders     None

## 2023-04-14 LAB — I-STAT CHEM 8, ED
BUN: 17 mg/dL (ref 6–20)
Calcium, Ion: 1.14 mmol/L — ABNORMAL LOW (ref 1.15–1.40)
Chloride: 101 mmol/L (ref 98–111)
Creatinine, Ser: 0.5 mg/dL (ref 0.44–1.00)
Glucose, Bld: 204 mg/dL — ABNORMAL HIGH (ref 70–99)
HCT: 42 % (ref 36.0–46.0)
Hemoglobin: 14.3 g/dL (ref 12.0–15.0)
Potassium: 3.7 mmol/L (ref 3.5–5.1)
Sodium: 138 mmol/L (ref 135–145)
TCO2: 24 mmol/L (ref 22–32)

## 2023-04-14 MED ORDER — OXYCODONE-ACETAMINOPHEN 5-325 MG PO TABS: 1.0000 | ORAL_TABLET | Freq: Four times a day (QID) | ORAL | 0 refills | Status: AC | PRN

## 2023-04-14 MED ORDER — ONDANSETRON 4 MG PO TBDP: 4.0000 mg | ORAL_TABLET | Freq: Three times a day (TID) | ORAL | 0 refills | Status: AC | PRN
